# Patient Record
Sex: Male | Born: 1975 | Race: White | Hispanic: No | Marital: Married | Smoking: Current every day smoker
Health system: Southern US, Community
[De-identification: ages and names within clinical notes are randomized; demographics above are authoritative.]

## PROBLEM LIST (undated history)

## (undated) DIAGNOSIS — H8109 Meniere's disease, unspecified ear: Secondary | ICD-10-CM

## (undated) HISTORY — DX: Meniere's disease, unspecified ear: H81.09

## (undated) HISTORY — PX: OTHER SURGICAL HISTORY: SHX169

---

## 2011-03-22 ENCOUNTER — Emergency Department (HOSPITAL_COMMUNITY): Payer: BC Managed Care – PPO

## 2011-03-22 ENCOUNTER — Observation Stay (HOSPITAL_COMMUNITY)
Admission: EM | Admit: 2011-03-22 | Discharge: 2011-03-23 | Disposition: A | Discharge status: Home / Self Care | Payer: BC Managed Care – PPO | Attending: General Surgery | Admitting: General Surgery

## 2011-03-22 DIAGNOSIS — H8109 Meniere's disease, unspecified ear: Secondary | ICD-10-CM | POA: Insufficient documentation

## 2011-03-22 DIAGNOSIS — F17200 Nicotine dependence, unspecified, uncomplicated: Secondary | ICD-10-CM | POA: Insufficient documentation

## 2011-03-22 DIAGNOSIS — Y92410 Unspecified street and highway as the place of occurrence of the external cause: Secondary | ICD-10-CM | POA: Insufficient documentation

## 2011-03-22 DIAGNOSIS — S060X9A Concussion with loss of consciousness of unspecified duration, initial encounter: Principal | ICD-10-CM | POA: Insufficient documentation

## 2011-03-22 LAB — COMPREHENSIVE METABOLIC PANEL
ALT: 34 U/L (ref 0–53)
AST: 27 U/L (ref 0–37)
Albumin: 4.4 g/dL (ref 3.5–5.2)
Alkaline Phosphatase: 61 U/L (ref 39–117)
BUN: 13 mg/dL (ref 6–23)
CO2: 25 mEq/L (ref 19–32)
Calcium: 9.4 mg/dL (ref 8.4–10.5)
Chloride: 104 mEq/L (ref 96–112)
Creatinine, Ser: 1.11 mg/dL (ref 0.4–1.5)
GFR calc Af Amer: >60 mL/min (ref >60)
GFR calc non Af Amer: >60 mL/min (ref >60)
Glucose, Bld: 127 mg/dL — ABNORMAL HIGH (ref 70–99)
Potassium: 3.1 mEq/L — ABNORMAL LOW (ref 3.5–5.1)
Sodium: 140 meq/L (ref 135–145)
Total Bilirubin: 0.8 mg/dL (ref 0.3–1.2)
Total Protein: 7.2 g/dL (ref 6.0–8.3)

## 2011-03-22 LAB — PROTIME-INR
INR: 0.97 (ref 0.00–1.49)
Prothrombin Time: 13.1 s (ref 11.6–15.2)

## 2011-03-22 LAB — POCT I-STAT, CHEM 8
BUN: 19 mg/dL (ref 6–23)
Calcium, Ion: 0.99 mmol/L — ABNORMAL LOW (ref 1.12–1.32)
Chloride: 107 mEq/L (ref 96–112)
Creatinine, Ser: 1.1 mg/dL (ref 0.4–1.5)
Glucose, Bld: 126 mg/dL — ABNORMAL HIGH (ref 70–99)
HCT: 50 % (ref 39.0–52.0)
Hemoglobin: 17 g/dL (ref 13.0–17.0)
Potassium: 3.7 meq/L (ref 3.5–5.1)
Sodium: 139 meq/L (ref 135–145)
TCO2: 22 mmol/L (ref 0–100)

## 2011-03-22 LAB — LACTIC ACID, PLASMA: Lactic Acid, Venous: 2.8 mmol/L — ABNORMAL HIGH (ref 0.5–2.2)

## 2011-03-22 LAB — CBC
HCT: 46.3 % (ref 39.0–52.0)
Hemoglobin: 15.9 g/dL (ref 13.0–17.0)
MCH: 30.2 pg (ref 26.0–34.0)
MCHC: 34.3 g/dL (ref 30.0–36.0)
MCV: 88 fL (ref 78.0–100.0)
Platelets: 212 10*3/uL (ref 150–400)
RBC: 5.26 MIL/uL (ref 4.22–5.81)
RDW: 13.2 % (ref 11.5–15.5)
WBC: 10.9 10*3/uL — ABNORMAL HIGH (ref 4.0–10.5)

## 2011-03-22 LAB — ETHANOL: Alcohol, Ethyl (B): <5 mg/dL (ref 0–10)
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
## 2011-05-13 DIAGNOSIS — S060X9A Concussion with loss of consciousness of unspecified duration, initial encounter: Secondary | ICD-10-CM

## 2011-05-13 DIAGNOSIS — F0781 Postconcussional syndrome: Secondary | ICD-10-CM

## 2012-03-12 DIAGNOSIS — F0781 Postconcussional syndrome: Secondary | ICD-10-CM

## 2012-03-12 DIAGNOSIS — X58XXXA Exposure to other specified factors, initial encounter: Secondary | ICD-10-CM

## 2012-03-12 DIAGNOSIS — S060X9A Concussion with loss of consciousness of unspecified duration, initial encounter: Secondary | ICD-10-CM

## 2012-03-13 DIAGNOSIS — F0781 Postconcussional syndrome: Secondary | ICD-10-CM

## 2012-03-13 DIAGNOSIS — S060X9A Concussion with loss of consciousness of unspecified duration, initial encounter: Secondary | ICD-10-CM

## 2012-03-13 DIAGNOSIS — X58XXXA Exposure to other specified factors, initial encounter: Secondary | ICD-10-CM
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
## 2012-11-01 ENCOUNTER — Encounter (HOSPITAL_COMMUNITY): Payer: Self-pay | Admitting: Psychology

## 2012-11-01 ENCOUNTER — Ambulatory Visit (INDEPENDENT_AMBULATORY_CARE_PROVIDER_SITE_OTHER): Payer: 59 | Admitting: Psychology

## 2012-11-01 DIAGNOSIS — F0781 Postconcussional syndrome: Secondary | ICD-10-CM
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
## 2012-11-15 ENCOUNTER — Ambulatory Visit (INDEPENDENT_AMBULATORY_CARE_PROVIDER_SITE_OTHER): Payer: 59 | Admitting: Psychology

## 2012-11-15 DIAGNOSIS — F0781 Postconcussional syndrome: Secondary | ICD-10-CM

## 2012-11-20 ENCOUNTER — Encounter (HOSPITAL_COMMUNITY): Payer: Self-pay | Admitting: Psychology
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
## 2013-01-14 ENCOUNTER — Ambulatory Visit (INDEPENDENT_AMBULATORY_CARE_PROVIDER_SITE_OTHER): Payer: 59 | Admitting: Psychology

## 2013-01-14 DIAGNOSIS — F0781 Postconcussional syndrome: Secondary | ICD-10-CM

## 2013-01-17 ENCOUNTER — Encounter (HOSPITAL_COMMUNITY): Payer: Self-pay | Admitting: Psychology
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
## 2013-01-29 ENCOUNTER — Encounter (HOSPITAL_COMMUNITY): Payer: Self-pay | Admitting: Psychology

## 2013-01-29 ENCOUNTER — Ambulatory Visit (INDEPENDENT_AMBULATORY_CARE_PROVIDER_SITE_OTHER): Payer: Self-pay | Admitting: Psychology

## 2013-01-29 DIAGNOSIS — F0781 Postconcussional syndrome: Secondary | ICD-10-CM
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
## 2013-02-19 ENCOUNTER — Ambulatory Visit (INDEPENDENT_AMBULATORY_CARE_PROVIDER_SITE_OTHER): Payer: 59 | Admitting: Psychology

## 2013-02-19 ENCOUNTER — Encounter (HOSPITAL_COMMUNITY): Payer: Self-pay | Admitting: Psychology

## 2013-02-19 DIAGNOSIS — F0781 Postconcussional syndrome: Secondary | ICD-10-CM
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
## 2013-03-19 ENCOUNTER — Ambulatory Visit (INDEPENDENT_AMBULATORY_CARE_PROVIDER_SITE_OTHER): Payer: 59 | Admitting: Psychology

## 2013-03-19 ENCOUNTER — Encounter (HOSPITAL_COMMUNITY): Payer: Self-pay | Admitting: Psychology

## 2013-03-19 DIAGNOSIS — F0781 Postconcussional syndrome: Secondary | ICD-10-CM
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
## 2015-03-26 ENCOUNTER — Other Ambulatory Visit: Payer: Self-pay | Admitting: Otolaryngology

## 2015-03-26 DIAGNOSIS — H81311 Aural vertigo, right ear: Secondary | ICD-10-CM

## 2015-03-26 DIAGNOSIS — H9190 Unspecified hearing loss, unspecified ear: Secondary | ICD-10-CM

## 2015-03-26 DIAGNOSIS — H8101 Meniere's disease, right ear: Secondary | ICD-10-CM

## 2015-04-07 ENCOUNTER — Inpatient Hospital Stay: Admission: RE | Admit: 2015-04-07 | Payer: Self-pay | Source: Ambulatory Visit

## 2015-04-24 ENCOUNTER — Ambulatory Visit
Admission: RE | Admit: 2015-04-24 | Discharge: 2015-04-24 | Disposition: A | Discharge status: Home / Self Care | Payer: BLUE CROSS/BLUE SHIELD | Source: Ambulatory Visit | Attending: Otolaryngology | Admitting: Otolaryngology

## 2015-04-24 DIAGNOSIS — H9190 Unspecified hearing loss, unspecified ear: Secondary | ICD-10-CM

## 2015-04-24 DIAGNOSIS — H81311 Aural vertigo, right ear: Secondary | ICD-10-CM

## 2015-04-24 DIAGNOSIS — H8101 Meniere's disease, right ear: Secondary | ICD-10-CM

## 2015-04-24 MED ORDER — GADOBENATE DIMEGLUMINE 529 MG/ML IV SOLN
20.0000 mL | Freq: Once | INTRAVENOUS | Status: AC | PRN
  Administered 2015-04-24: 20 mL via INTRAVENOUS

## 2015-06-22 DIAGNOSIS — H81311 Aural vertigo, right ear: Secondary | ICD-10-CM | POA: Insufficient documentation

## 2016-05-31 IMAGING — MR MR HEAD WO/W CM
10 of 11 series · 33 of 48 positions shown · IV contrast (20ml multihance)
Comparison: Head CT 03/22/2011

CLINICAL DATA: Right-sided hearing loss with vertigo and loss of
balance. History of Meniere's disease. History of shunt placed in
the right ear in 6116. History of mastoidectomy.

EXAM:
MRI HEAD WITHOUT AND WITH CONTRAST
TECHNIQUE: Multiplanar, multiecho pulse sequences of the brain and surrounding
structures were obtained without and with intravenous contrast.
CONTRAST:  20mL MULTIHANCE GADOBENATE DIMEGLUMINE 529 MG/ML IV SOLN

[Series 2: T1 · sagittal · 5.0mm · 0.47mm/px · 2 of 21 slices shown (1 of 3)]
[im 1/21]
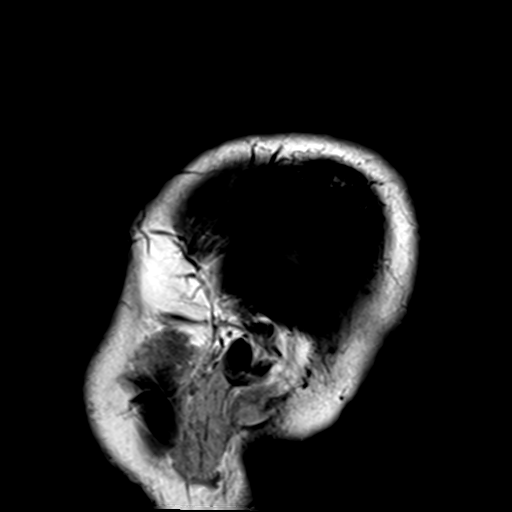
[im 21/21]
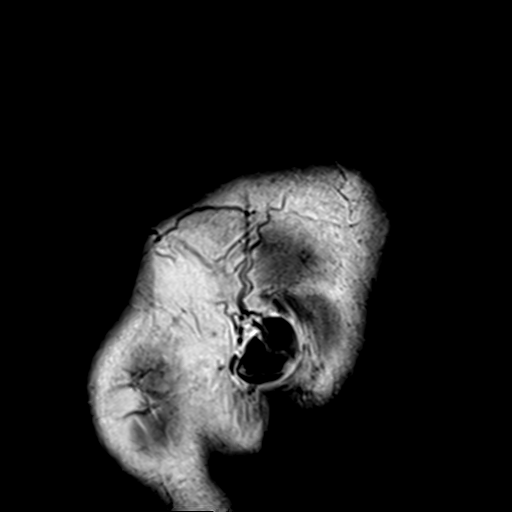

[Series 3: DWI · axial · 3.0mm · 1.80mm/px · z∈[+27,+180]mm · 9 of 108 slices shown (1 of 2)]
[im 1/108]
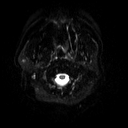
[im 18/108]
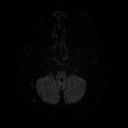
[im 36/108]
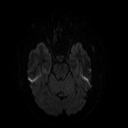
[im 45/108]
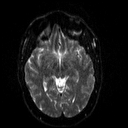
[im 54/108]
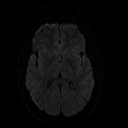
[im 63/108]
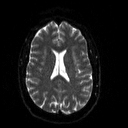
[im 72/108]
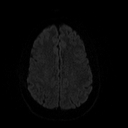
[im 90/108]
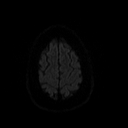
[im 108/108]
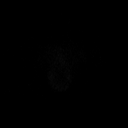

[Series 4: DWI · axial · 3.0mm · 1.80mm/px · z∈[+27,+180]mm · 6 of 53 slices shown (2 of 2)]
[im 1/53]
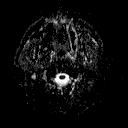
[im 11/53]
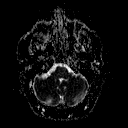
[im 21/53]
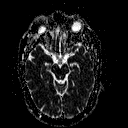
[im 32/53]
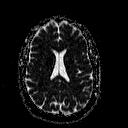
[im 42/53]
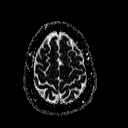
[im 53/53]
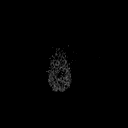

[Series 5: T2 · axial · 5.0mm · 0.45mm/px · z∈[+36,+184]mm · 3 of 25 slices shown]
[im 1/25]
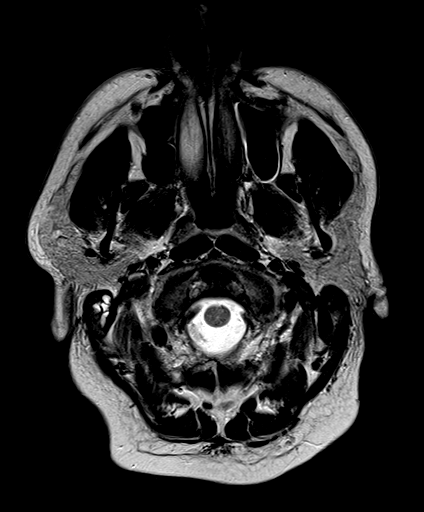
[im 13/25]
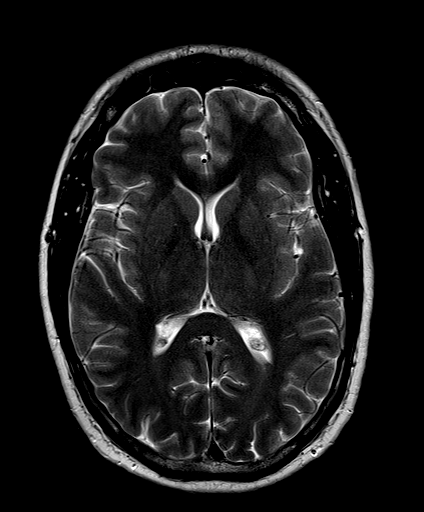
[im 25/25]
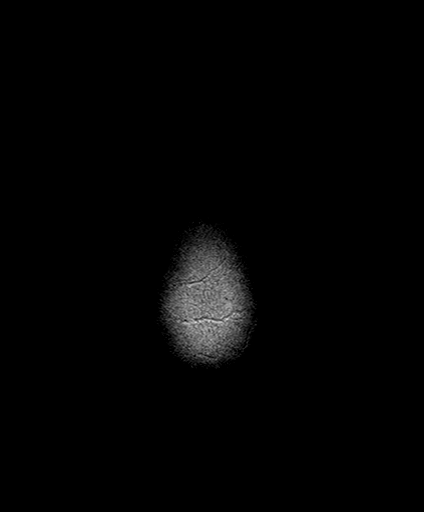

[Series 6: FLAIR · axial · 5.0mm · 0.45mm/px · z∈[+36,+183]mm · 3 of 25 slices shown]
[im 1/25]
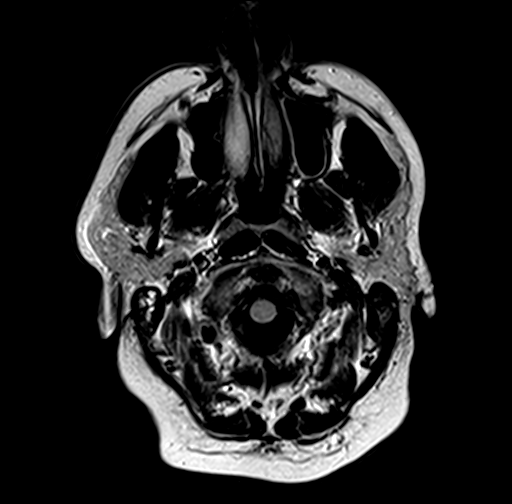
[im 13/25]
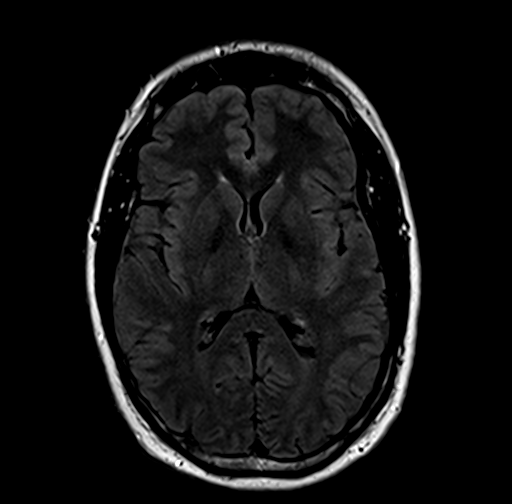
[im 25/25]
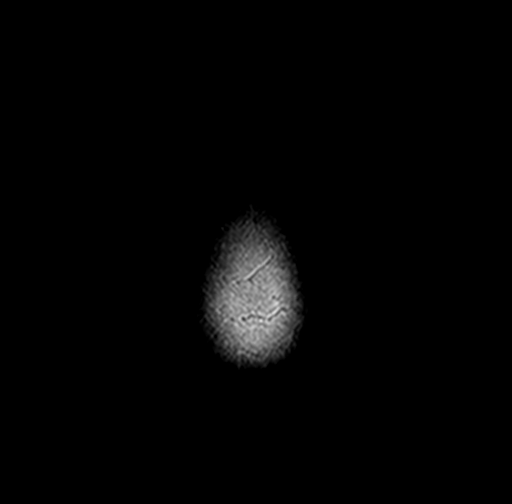

[Series 7: T1 · coronal · 3.0mm · 0.35mm/px · 2 of 14 slices shown (2 of 3)]
[im 1/14]
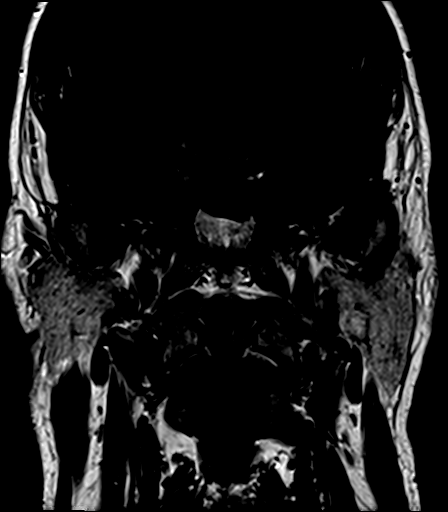
[im 14/14]
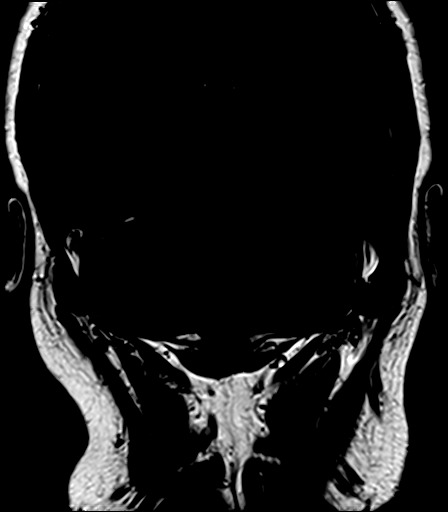

[Series 8: T1 · axial · 3.0mm · 0.35mm/px · z∈[-1,+42]mm · 2 of 14 slices shown (3 of 3)]
[im 1/14]
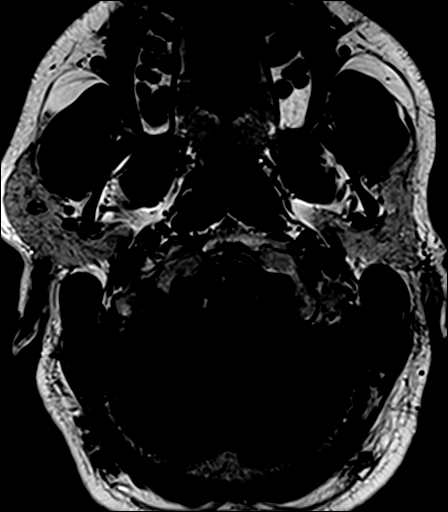
[im 14/14]
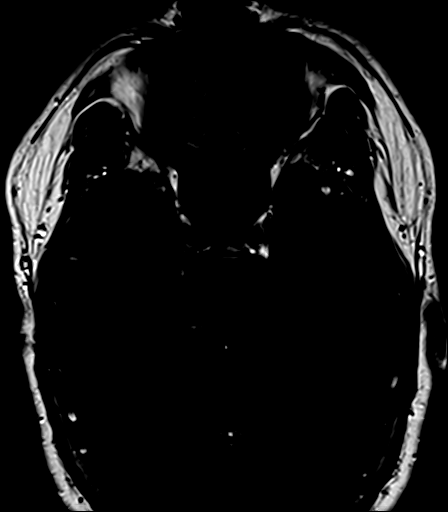

[Series 9: bSSFP · axial · 1.0mm · 0.28mm/px · z∈[+3,+14]mm · 2 of 36 slices shown]
[im 1/36]
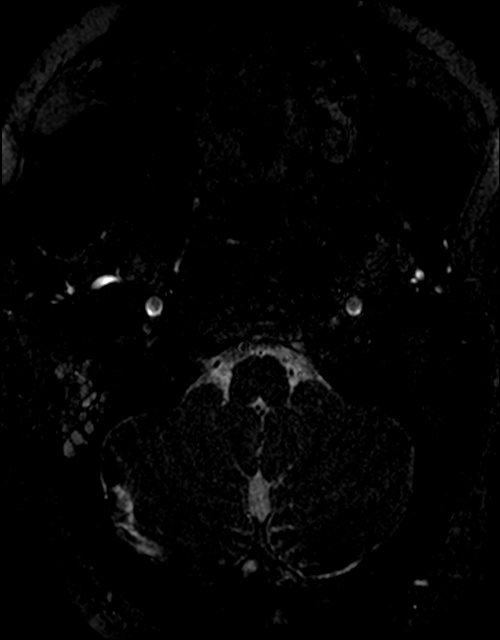
[im 12/36]
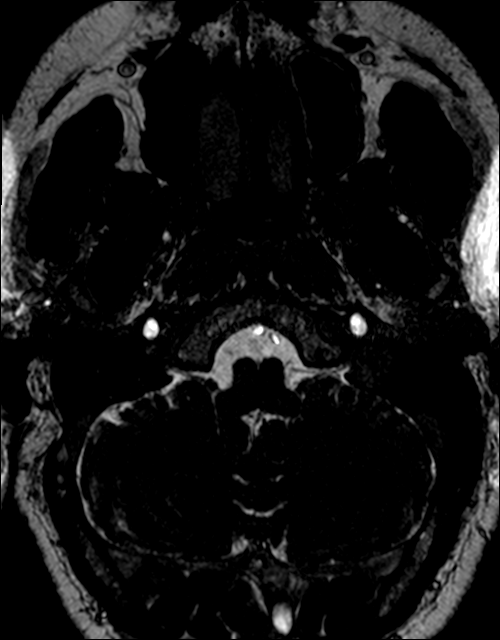

[Series 10: T1 post-contrast · coronal · 3.0mm · 0.35mm/px · 2 of 14 slices shown (1 of 2)]
[im 1/14]
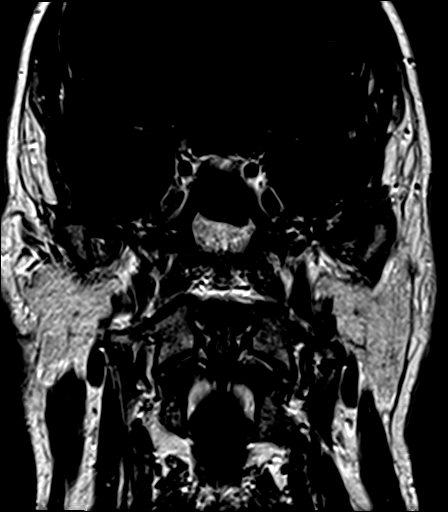
[im 14/14]
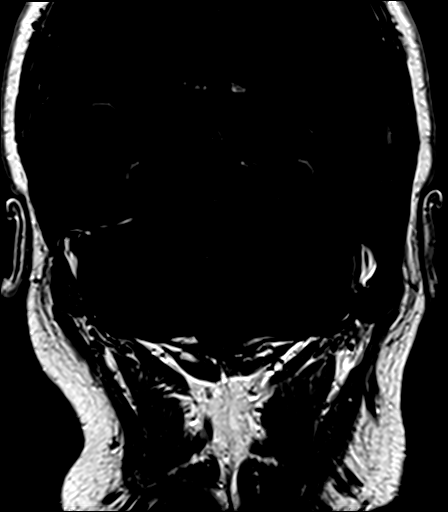

[Series 11: T1 post-contrast · axial · 3.0mm · 0.35mm/px · z∈[-1,+42]mm · 2 of 14 slices shown (2 of 2)]
[im 1/14]
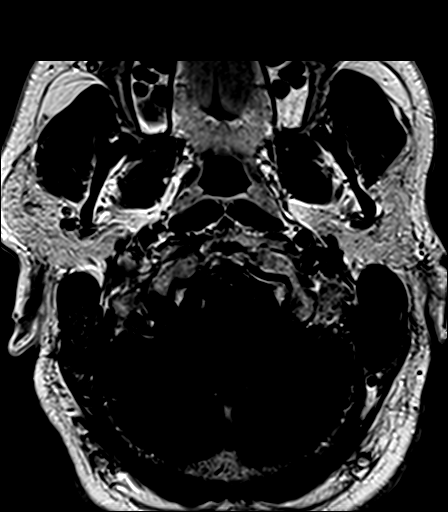
[im 14/14]
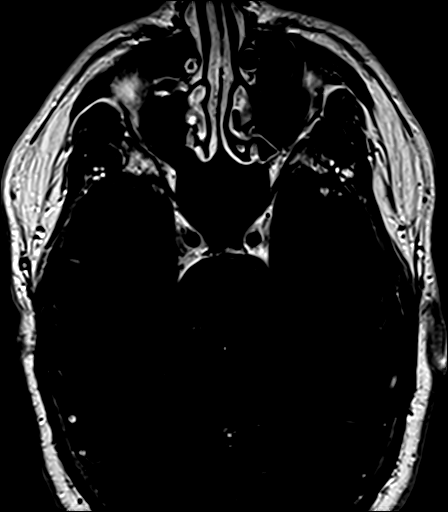

[33 of 48 positions shown; findings below may reference images not displayed]

FINDINGS: Calvarium and upper cervical spine: No marrow signal abnormality.

Orbits: No significant findings.

Sinuses and mastoid: There is chronic complete opacification of a
posterior right ethmoid air cell with central T2 hyperintense
material, likely inspissated secretion. The sinus is mildly
expansile, but stable from 5605. Over this long interval, would have
expected a mucocele to enlarge. There is also a chronically
opacified air cell at the roof of the right orbit, without expansion
on sagittal T1 weighted imaging.

Right wall up mastoidectomy, better depicted on previous CT. Chronic
fluid or mucosal thickening in the right mastoid tip with sclerosis
seen by previous CT. There is no diffusion signal abnormality in the
temporal bones to suggest cholesteatoma. Symmetric and normal
labyrinthine signal and absent enhancement. No vascular loop.

Brain: No acute or remote infarct, hemorrhage, hydrocephalus, or
mass lesion. No evidence of large vessel occlusion. No abnormal
intracranial enhancement.
IMPRESSION: 1. No vestibular schwannoma or other explanation for right hearing
loss.
2. Chronic mastoiditis in the right mastoid tip. A wall up right
mastoidectomy defect is clear. No temporal bone diffusion signal to
suggest cholesteatoma.
3. Chronic sinusitis, stable from 5605.

## 2016-10-13 DIAGNOSIS — Z23 Encounter for immunization: Secondary | ICD-10-CM | POA: Diagnosis not present

## 2017-02-06 DIAGNOSIS — J0140 Acute pansinusitis, unspecified: Secondary | ICD-10-CM | POA: Diagnosis not present

## 2017-05-20 DIAGNOSIS — S42032A Displaced fracture of lateral end of left clavicle, initial encounter for closed fracture: Secondary | ICD-10-CM | POA: Diagnosis not present

## 2017-05-20 DIAGNOSIS — S42002A Fracture of unspecified part of left clavicle, initial encounter for closed fracture: Secondary | ICD-10-CM | POA: Diagnosis not present

## 2017-05-20 DIAGNOSIS — G8911 Acute pain due to trauma: Secondary | ICD-10-CM | POA: Diagnosis not present

## 2017-05-20 DIAGNOSIS — S20412A Abrasion of left back wall of thorax, initial encounter: Secondary | ICD-10-CM | POA: Diagnosis not present

## 2017-05-20 DIAGNOSIS — M25512 Pain in left shoulder: Secondary | ICD-10-CM | POA: Diagnosis not present

## 2017-05-22 DIAGNOSIS — S42025A Nondisplaced fracture of shaft of left clavicle, initial encounter for closed fracture: Secondary | ICD-10-CM | POA: Diagnosis not present

## 2017-05-24 DIAGNOSIS — S42032A Displaced fracture of lateral end of left clavicle, initial encounter for closed fracture: Secondary | ICD-10-CM | POA: Diagnosis not present

## 2017-05-24 DIAGNOSIS — S42022A Displaced fracture of shaft of left clavicle, initial encounter for closed fracture: Secondary | ICD-10-CM | POA: Diagnosis not present

## 2017-05-24 DIAGNOSIS — Y999 Unspecified external cause status: Secondary | ICD-10-CM | POA: Diagnosis not present

## 2017-06-05 DIAGNOSIS — S42032D Displaced fracture of lateral end of left clavicle, subsequent encounter for fracture with routine healing: Secondary | ICD-10-CM | POA: Diagnosis not present

## 2017-06-13 DIAGNOSIS — D485 Neoplasm of uncertain behavior of skin: Secondary | ICD-10-CM | POA: Diagnosis not present

## 2017-06-13 DIAGNOSIS — D225 Melanocytic nevi of trunk: Secondary | ICD-10-CM | POA: Diagnosis not present

## 2017-06-13 DIAGNOSIS — D2262 Melanocytic nevi of left upper limb, including shoulder: Secondary | ICD-10-CM | POA: Diagnosis not present

## 2017-06-26 DIAGNOSIS — S42032D Displaced fracture of lateral end of left clavicle, subsequent encounter for fracture with routine healing: Secondary | ICD-10-CM | POA: Diagnosis not present

## 2017-08-01 DIAGNOSIS — S42032D Displaced fracture of lateral end of left clavicle, subsequent encounter for fracture with routine healing: Secondary | ICD-10-CM | POA: Diagnosis not present

## 2017-09-19 DIAGNOSIS — S42032D Displaced fracture of lateral end of left clavicle, subsequent encounter for fracture with routine healing: Secondary | ICD-10-CM | POA: Diagnosis not present

## 2017-10-02 DIAGNOSIS — H6980 Other specified disorders of Eustachian tube, unspecified ear: Secondary | ICD-10-CM | POA: Diagnosis not present

## 2017-10-02 DIAGNOSIS — H9313 Tinnitus, bilateral: Secondary | ICD-10-CM | POA: Insufficient documentation

## 2017-10-02 DIAGNOSIS — J322 Chronic ethmoidal sinusitis: Secondary | ICD-10-CM | POA: Insufficient documentation

## 2017-10-02 DIAGNOSIS — H6990 Unspecified Eustachian tube disorder, unspecified ear: Secondary | ICD-10-CM | POA: Insufficient documentation

## 2017-10-02 DIAGNOSIS — J310 Chronic rhinitis: Secondary | ICD-10-CM | POA: Insufficient documentation

## 2017-10-02 DIAGNOSIS — H8101 Meniere's disease, right ear: Secondary | ICD-10-CM | POA: Insufficient documentation

## 2017-12-27 DIAGNOSIS — D1801 Hemangioma of skin and subcutaneous tissue: Secondary | ICD-10-CM | POA: Diagnosis not present

## 2017-12-27 DIAGNOSIS — D225 Melanocytic nevi of trunk: Secondary | ICD-10-CM | POA: Diagnosis not present

## 2017-12-27 DIAGNOSIS — L814 Other melanin hyperpigmentation: Secondary | ICD-10-CM | POA: Diagnosis not present

## 2017-12-27 DIAGNOSIS — D485 Neoplasm of uncertain behavior of skin: Secondary | ICD-10-CM | POA: Diagnosis not present

## 2017-12-27 DIAGNOSIS — Z86018 Personal history of other benign neoplasm: Secondary | ICD-10-CM | POA: Diagnosis not present

## 2018-03-21 DIAGNOSIS — J0100 Acute maxillary sinusitis, unspecified: Secondary | ICD-10-CM | POA: Diagnosis not present

## 2018-07-16 DIAGNOSIS — H9041 Sensorineural hearing loss, unilateral, right ear, with unrestricted hearing on the contralateral side: Secondary | ICD-10-CM | POA: Diagnosis not present

## 2018-07-16 DIAGNOSIS — H8101 Meniere's disease, right ear: Secondary | ICD-10-CM | POA: Diagnosis not present

## 2018-07-16 DIAGNOSIS — G43809 Other migraine, not intractable, without status migrainosus: Secondary | ICD-10-CM | POA: Diagnosis not present

## 2018-07-16 DIAGNOSIS — H9311 Tinnitus, right ear: Secondary | ICD-10-CM | POA: Diagnosis not present

## 2018-07-27 DIAGNOSIS — R42 Dizziness and giddiness: Secondary | ICD-10-CM | POA: Diagnosis not present

## 2018-10-09 DIAGNOSIS — J329 Chronic sinusitis, unspecified: Secondary | ICD-10-CM | POA: Diagnosis not present

## 2018-10-09 DIAGNOSIS — H8101 Meniere's disease, right ear: Secondary | ICD-10-CM | POA: Diagnosis not present

## 2018-10-09 DIAGNOSIS — J3489 Other specified disorders of nose and nasal sinuses: Secondary | ICD-10-CM | POA: Diagnosis not present

## 2018-10-09 DIAGNOSIS — J309 Allergic rhinitis, unspecified: Secondary | ICD-10-CM | POA: Diagnosis not present

## 2018-10-11 DIAGNOSIS — J328 Other chronic sinusitis: Secondary | ICD-10-CM | POA: Diagnosis not present

## 2018-10-17 DIAGNOSIS — J301 Allergic rhinitis due to pollen: Secondary | ICD-10-CM | POA: Diagnosis not present

## 2018-10-23 DIAGNOSIS — J329 Chronic sinusitis, unspecified: Secondary | ICD-10-CM | POA: Diagnosis not present

## 2018-10-23 DIAGNOSIS — H8101 Meniere's disease, right ear: Secondary | ICD-10-CM | POA: Diagnosis not present

## 2018-11-19 DIAGNOSIS — G43809 Other migraine, not intractable, without status migrainosus: Secondary | ICD-10-CM | POA: Diagnosis not present

## 2018-11-19 DIAGNOSIS — H8101 Meniere's disease, right ear: Secondary | ICD-10-CM | POA: Diagnosis not present

## 2018-11-19 DIAGNOSIS — H9041 Sensorineural hearing loss, unilateral, right ear, with unrestricted hearing on the contralateral side: Secondary | ICD-10-CM | POA: Diagnosis not present

## 2018-11-19 DIAGNOSIS — H9313 Tinnitus, bilateral: Secondary | ICD-10-CM | POA: Diagnosis not present

## 2018-11-29 DIAGNOSIS — G43809 Other migraine, not intractable, without status migrainosus: Secondary | ICD-10-CM | POA: Diagnosis not present

## 2018-11-29 DIAGNOSIS — J3489 Other specified disorders of nose and nasal sinuses: Secondary | ICD-10-CM | POA: Diagnosis not present

## 2018-11-29 DIAGNOSIS — H8101 Meniere's disease, right ear: Secondary | ICD-10-CM | POA: Diagnosis not present

## 2018-12-23 DIAGNOSIS — J019 Acute sinusitis, unspecified: Secondary | ICD-10-CM | POA: Diagnosis not present

## 2018-12-23 DIAGNOSIS — J069 Acute upper respiratory infection, unspecified: Secondary | ICD-10-CM | POA: Diagnosis not present

## 2019-02-19 DIAGNOSIS — J329 Chronic sinusitis, unspecified: Secondary | ICD-10-CM | POA: Diagnosis not present

## 2019-02-19 DIAGNOSIS — J342 Deviated nasal septum: Secondary | ICD-10-CM | POA: Diagnosis not present

## 2019-02-19 DIAGNOSIS — H8101 Meniere's disease, right ear: Secondary | ICD-10-CM | POA: Diagnosis not present

## 2019-02-19 DIAGNOSIS — J3489 Other specified disorders of nose and nasal sinuses: Secondary | ICD-10-CM | POA: Diagnosis not present

## 2019-02-20 DIAGNOSIS — R42 Dizziness and giddiness: Secondary | ICD-10-CM | POA: Diagnosis not present

## 2019-02-20 DIAGNOSIS — H8101 Meniere's disease, right ear: Secondary | ICD-10-CM | POA: Diagnosis not present

## 2019-02-20 DIAGNOSIS — H903 Sensorineural hearing loss, bilateral: Secondary | ICD-10-CM | POA: Diagnosis not present

## 2019-02-20 DIAGNOSIS — G43809 Other migraine, not intractable, without status migrainosus: Secondary | ICD-10-CM | POA: Diagnosis not present

## 2019-02-25 DIAGNOSIS — D225 Melanocytic nevi of trunk: Secondary | ICD-10-CM | POA: Diagnosis not present

## 2019-02-25 DIAGNOSIS — D485 Neoplasm of uncertain behavior of skin: Secondary | ICD-10-CM | POA: Diagnosis not present

## 2019-02-25 DIAGNOSIS — D235 Other benign neoplasm of skin of trunk: Secondary | ICD-10-CM | POA: Diagnosis not present

## 2019-02-25 DIAGNOSIS — Z86018 Personal history of other benign neoplasm: Secondary | ICD-10-CM | POA: Diagnosis not present

## 2019-02-25 DIAGNOSIS — D2261 Melanocytic nevi of right upper limb, including shoulder: Secondary | ICD-10-CM | POA: Diagnosis not present

## 2019-03-25 DIAGNOSIS — J329 Chronic sinusitis, unspecified: Secondary | ICD-10-CM | POA: Diagnosis not present

## 2019-03-25 DIAGNOSIS — H9041 Sensorineural hearing loss, unilateral, right ear, with unrestricted hearing on the contralateral side: Secondary | ICD-10-CM | POA: Diagnosis not present

## 2019-06-07 DIAGNOSIS — H8101 Meniere's disease, right ear: Secondary | ICD-10-CM | POA: Diagnosis not present

## 2019-06-14 DIAGNOSIS — H6691 Otitis media, unspecified, right ear: Secondary | ICD-10-CM | POA: Diagnosis not present

## 2019-06-14 DIAGNOSIS — H7291 Unspecified perforation of tympanic membrane, right ear: Secondary | ICD-10-CM | POA: Diagnosis not present

## 2019-06-25 DIAGNOSIS — H7291 Unspecified perforation of tympanic membrane, right ear: Secondary | ICD-10-CM | POA: Diagnosis not present

## 2019-06-25 DIAGNOSIS — H8101 Meniere's disease, right ear: Secondary | ICD-10-CM | POA: Diagnosis not present

## 2019-07-10 DIAGNOSIS — H7291 Unspecified perforation of tympanic membrane, right ear: Secondary | ICD-10-CM | POA: Diagnosis not present

## 2019-07-31 DIAGNOSIS — Z01812 Encounter for preprocedural laboratory examination: Secondary | ICD-10-CM | POA: Diagnosis not present

## 2019-08-06 DIAGNOSIS — H722X1 Other marginal perforations of tympanic membrane, right ear: Secondary | ICD-10-CM | POA: Diagnosis not present

## 2019-10-14 DIAGNOSIS — H9041 Sensorineural hearing loss, unilateral, right ear, with unrestricted hearing on the contralateral side: Secondary | ICD-10-CM | POA: Diagnosis not present

## 2020-03-02 DIAGNOSIS — D225 Melanocytic nevi of trunk: Secondary | ICD-10-CM | POA: Diagnosis not present

## 2020-03-02 DIAGNOSIS — Z86018 Personal history of other benign neoplasm: Secondary | ICD-10-CM | POA: Diagnosis not present

## 2020-03-02 DIAGNOSIS — D2271 Melanocytic nevi of right lower limb, including hip: Secondary | ICD-10-CM | POA: Diagnosis not present

## 2020-03-02 DIAGNOSIS — L910 Hypertrophic scar: Secondary | ICD-10-CM | POA: Diagnosis not present

## 2020-03-02 DIAGNOSIS — D2239 Melanocytic nevi of other parts of face: Secondary | ICD-10-CM | POA: Diagnosis not present

## 2020-03-06 ENCOUNTER — Ambulatory Visit: Payer: BC Managed Care – PPO | Attending: Internal Medicine

## 2020-03-06 DIAGNOSIS — Z23 Encounter for immunization: Secondary | ICD-10-CM

## 2020-03-06 NOTE — Progress Notes (Signed)
   Covid-19 Vaccination Clinic  Name:  [name]    MRN: [id number] DOB: 1976/10/12  03/06/2020  [name] was observed post Covid-19 immunization for 15 minutes without incident. He was provided with Vaccine Information Sheet and instruction to access the V-Safe system.   [name] was instructed to call 911 with any severe reactions post vaccine: Marland Kitchen Difficulty breathing  . Swelling of face and throat  . A fast heartbeat  . A bad rash all over body  . Dizziness and weakness   Immunizations Administered    Name Date Dose VIS Date Route   Pfizer COVID-19 Vaccine 03/06/2020 10:08 AM 0.3 mL 11/22/2019 Intramuscular   Manufacturer: [employer], [employer]   Lot: [id number]   NDC: [address]-1

## 2020-03-31 ENCOUNTER — Ambulatory Visit: Payer: Self-pay

## 2020-04-07 ENCOUNTER — Ambulatory Visit: Payer: BC Managed Care – PPO | Attending: Internal Medicine

## 2020-04-07 DIAGNOSIS — Z23 Encounter for immunization: Secondary | ICD-10-CM

## 2020-04-07 NOTE — Progress Notes (Signed)
   Covid-19 Vaccination Clinic  Name:  [name]    MRN: [id number] DOB: 06/28/1976  04/07/2020  [name] was observed post Covid-19 immunization for 15 minutes without incident. He was provided with Vaccine Information Sheet and instruction to access the V-Safe system.   [name] was instructed to call 911 with any severe reactions post vaccine: Marland Kitchen Difficulty breathing  . Swelling of face and throat  . A fast heartbeat  . A bad rash all over body  . Dizziness and weakness   Immunizations Administered    Name Date Dose VIS Date Route   Pfizer COVID-19 Vaccine 04/07/2020 10:57 AM 0.3 mL 02/05/2019 Intramuscular   Manufacturer: [employer], [employer]   Lot: [id number]   NDC: [address]-6

## 2020-04-09 DIAGNOSIS — L910 Hypertrophic scar: Secondary | ICD-10-CM | POA: Diagnosis not present

## 2020-04-13 DIAGNOSIS — H8101 Meniere's disease, right ear: Secondary | ICD-10-CM | POA: Diagnosis not present

## 2020-04-13 DIAGNOSIS — Z9889 Other specified postprocedural states: Secondary | ICD-10-CM | POA: Diagnosis not present

## 2020-04-13 DIAGNOSIS — H7291 Unspecified perforation of tympanic membrane, right ear: Secondary | ICD-10-CM | POA: Diagnosis not present

## 2020-08-26 ENCOUNTER — Other Ambulatory Visit: Payer: BC Managed Care – PPO

## 2020-08-26 ENCOUNTER — Other Ambulatory Visit: Payer: Self-pay

## 2020-08-26 DIAGNOSIS — Z20822 Contact with and (suspected) exposure to covid-19: Secondary | ICD-10-CM

## 2020-08-29 LAB — NOVEL CORONAVIRUS, NAA: SARS-CoV-2, NAA: NOT DETECTED

## 2020-10-12 DIAGNOSIS — G43809 Other migraine, not intractable, without status migrainosus: Secondary | ICD-10-CM | POA: Diagnosis not present

## 2020-10-12 DIAGNOSIS — H8101 Meniere's disease, right ear: Secondary | ICD-10-CM | POA: Diagnosis not present

## 2020-12-23 DIAGNOSIS — Z1152 Encounter for screening for COVID-19: Secondary | ICD-10-CM | POA: Diagnosis not present

## 2021-01-12 DIAGNOSIS — N410 Acute prostatitis: Secondary | ICD-10-CM | POA: Diagnosis not present

## 2021-01-12 DIAGNOSIS — R300 Dysuria: Secondary | ICD-10-CM | POA: Diagnosis not present

## 2021-02-21 DIAGNOSIS — H8103 Meniere's disease, bilateral: Secondary | ICD-10-CM | POA: Diagnosis not present

## 2021-03-09 DIAGNOSIS — D485 Neoplasm of uncertain behavior of skin: Secondary | ICD-10-CM | POA: Diagnosis not present

## 2021-03-09 DIAGNOSIS — L578 Other skin changes due to chronic exposure to nonionizing radiation: Secondary | ICD-10-CM | POA: Diagnosis not present

## 2021-03-09 DIAGNOSIS — Z86018 Personal history of other benign neoplasm: Secondary | ICD-10-CM | POA: Diagnosis not present

## 2021-03-09 DIAGNOSIS — D225 Melanocytic nevi of trunk: Secondary | ICD-10-CM | POA: Diagnosis not present

## 2021-03-09 DIAGNOSIS — L570 Actinic keratosis: Secondary | ICD-10-CM | POA: Diagnosis not present

## 2021-03-09 DIAGNOSIS — D2271 Melanocytic nevi of right lower limb, including hip: Secondary | ICD-10-CM | POA: Diagnosis not present

## 2021-03-09 DIAGNOSIS — L918 Other hypertrophic disorders of the skin: Secondary | ICD-10-CM | POA: Diagnosis not present

## 2021-03-09 DIAGNOSIS — L910 Hypertrophic scar: Secondary | ICD-10-CM | POA: Diagnosis not present

## 2021-03-19 DIAGNOSIS — J019 Acute sinusitis, unspecified: Secondary | ICD-10-CM | POA: Diagnosis not present

## 2021-08-06 DIAGNOSIS — N410 Acute prostatitis: Secondary | ICD-10-CM | POA: Diagnosis not present

## 2021-11-12 DIAGNOSIS — H9041 Sensorineural hearing loss, unilateral, right ear, with unrestricted hearing on the contralateral side: Secondary | ICD-10-CM | POA: Diagnosis not present

## 2021-12-28 DIAGNOSIS — N410 Acute prostatitis: Secondary | ICD-10-CM | POA: Diagnosis not present

## 2022-05-06 DIAGNOSIS — D239 Other benign neoplasm of skin, unspecified: Secondary | ICD-10-CM | POA: Diagnosis not present

## 2022-05-06 DIAGNOSIS — L578 Other skin changes due to chronic exposure to nonionizing radiation: Secondary | ICD-10-CM | POA: Diagnosis not present

## 2022-05-06 DIAGNOSIS — L910 Hypertrophic scar: Secondary | ICD-10-CM | POA: Diagnosis not present

## 2022-05-06 DIAGNOSIS — D225 Melanocytic nevi of trunk: Secondary | ICD-10-CM | POA: Diagnosis not present

## 2022-05-06 DIAGNOSIS — L821 Other seborrheic keratosis: Secondary | ICD-10-CM | POA: Diagnosis not present

## 2022-06-16 DIAGNOSIS — L910 Hypertrophic scar: Secondary | ICD-10-CM | POA: Diagnosis not present

## 2022-07-08 DIAGNOSIS — Z3009 Encounter for other general counseling and advice on contraception: Secondary | ICD-10-CM | POA: Diagnosis not present

## 2022-07-08 DIAGNOSIS — R102 Pelvic and perineal pain: Secondary | ICD-10-CM | POA: Diagnosis not present

## 2022-08-08 DIAGNOSIS — L910 Hypertrophic scar: Secondary | ICD-10-CM | POA: Diagnosis not present

## 2022-08-09 DIAGNOSIS — Z302 Encounter for sterilization: Secondary | ICD-10-CM | POA: Diagnosis not present

## 2023-03-21 DIAGNOSIS — Z86018 Personal history of other benign neoplasm: Secondary | ICD-10-CM | POA: Diagnosis not present

## 2023-03-21 DIAGNOSIS — D225 Melanocytic nevi of trunk: Secondary | ICD-10-CM | POA: Diagnosis not present

## 2023-03-21 DIAGNOSIS — L578 Other skin changes due to chronic exposure to nonionizing radiation: Secondary | ICD-10-CM | POA: Diagnosis not present

## 2023-03-21 DIAGNOSIS — D2272 Melanocytic nevi of left lower limb, including hip: Secondary | ICD-10-CM | POA: Diagnosis not present

## 2023-05-31 DIAGNOSIS — J0141 Acute recurrent pansinusitis: Secondary | ICD-10-CM | POA: Diagnosis not present

## 2023-06-20 DIAGNOSIS — J0140 Acute pansinusitis, unspecified: Secondary | ICD-10-CM | POA: Diagnosis not present

## 2023-06-20 DIAGNOSIS — J3489 Other specified disorders of nose and nasal sinuses: Secondary | ICD-10-CM | POA: Diagnosis not present

## 2023-06-21 DIAGNOSIS — H40013 Open angle with borderline findings, low risk, bilateral: Secondary | ICD-10-CM | POA: Diagnosis not present

## 2023-07-13 DIAGNOSIS — J324 Chronic pansinusitis: Secondary | ICD-10-CM | POA: Diagnosis not present

## 2023-08-04 DIAGNOSIS — J329 Chronic sinusitis, unspecified: Secondary | ICD-10-CM | POA: Diagnosis not present

## 2023-08-04 DIAGNOSIS — J324 Chronic pansinusitis: Secondary | ICD-10-CM | POA: Diagnosis not present

## 2023-08-09 DIAGNOSIS — J329 Chronic sinusitis, unspecified: Secondary | ICD-10-CM | POA: Diagnosis not present

## 2023-08-09 DIAGNOSIS — H8101 Meniere's disease, right ear: Secondary | ICD-10-CM | POA: Diagnosis not present

## 2023-08-31 DIAGNOSIS — Z13220 Encounter for screening for lipoid disorders: Secondary | ICD-10-CM | POA: Diagnosis not present

## 2023-08-31 DIAGNOSIS — Z125 Encounter for screening for malignant neoplasm of prostate: Secondary | ICD-10-CM | POA: Diagnosis not present

## 2023-08-31 DIAGNOSIS — Z0000 Encounter for general adult medical examination without abnormal findings: Secondary | ICD-10-CM | POA: Diagnosis not present

## 2023-11-06 DIAGNOSIS — M5412 Radiculopathy, cervical region: Secondary | ICD-10-CM | POA: Diagnosis not present

## 2023-11-06 DIAGNOSIS — M542 Cervicalgia: Secondary | ICD-10-CM | POA: Diagnosis not present

## 2023-11-23 DIAGNOSIS — M542 Cervicalgia: Secondary | ICD-10-CM | POA: Diagnosis not present

## 2023-12-01 DIAGNOSIS — R519 Headache, unspecified: Secondary | ICD-10-CM | POA: Diagnosis not present

## 2023-12-04 DIAGNOSIS — Z1389 Encounter for screening for other disorder: Secondary | ICD-10-CM | POA: Diagnosis not present

## 2023-12-08 DIAGNOSIS — M542 Cervicalgia: Secondary | ICD-10-CM | POA: Diagnosis not present

## 2023-12-18 DIAGNOSIS — M5412 Radiculopathy, cervical region: Secondary | ICD-10-CM | POA: Diagnosis not present

## 2023-12-22 DIAGNOSIS — M5412 Radiculopathy, cervical region: Secondary | ICD-10-CM | POA: Diagnosis not present

## 2024-01-15 DIAGNOSIS — M5412 Radiculopathy, cervical region: Secondary | ICD-10-CM | POA: Diagnosis not present

## 2024-01-19 DIAGNOSIS — M5412 Radiculopathy, cervical region: Secondary | ICD-10-CM | POA: Diagnosis not present

## 2024-01-23 DIAGNOSIS — J329 Chronic sinusitis, unspecified: Secondary | ICD-10-CM | POA: Diagnosis not present

## 2024-01-31 DIAGNOSIS — H9071 Mixed conductive and sensorineural hearing loss, unilateral, right ear, with unrestricted hearing on the contralateral side: Secondary | ICD-10-CM | POA: Diagnosis not present

## 2024-03-26 DIAGNOSIS — L578 Other skin changes due to chronic exposure to nonionizing radiation: Secondary | ICD-10-CM | POA: Diagnosis not present

## 2024-03-26 DIAGNOSIS — D2272 Melanocytic nevi of left lower limb, including hip: Secondary | ICD-10-CM | POA: Diagnosis not present

## 2024-03-26 DIAGNOSIS — Z86018 Personal history of other benign neoplasm: Secondary | ICD-10-CM | POA: Diagnosis not present

## 2024-03-26 DIAGNOSIS — D225 Melanocytic nevi of trunk: Secondary | ICD-10-CM | POA: Diagnosis not present

## 2024-05-15 DIAGNOSIS — J029 Acute pharyngitis, unspecified: Secondary | ICD-10-CM | POA: Diagnosis not present

## 2024-05-15 DIAGNOSIS — R059 Cough, unspecified: Secondary | ICD-10-CM | POA: Diagnosis not present

## 2024-05-15 DIAGNOSIS — J019 Acute sinusitis, unspecified: Secondary | ICD-10-CM | POA: Diagnosis not present

## 2024-05-15 DIAGNOSIS — Z20822 Contact with and (suspected) exposure to covid-19: Secondary | ICD-10-CM | POA: Diagnosis not present

## 2024-08-28 DIAGNOSIS — H8101 Meniere's disease, right ear: Secondary | ICD-10-CM | POA: Diagnosis not present

## 2024-08-28 DIAGNOSIS — H9042 Sensorineural hearing loss, unilateral, left ear, with unrestricted hearing on the contralateral side: Secondary | ICD-10-CM | POA: Diagnosis not present

## 2024-08-28 DIAGNOSIS — H9311 Tinnitus, right ear: Secondary | ICD-10-CM | POA: Diagnosis not present

## 2024-10-21 DIAGNOSIS — R519 Headache, unspecified: Secondary | ICD-10-CM | POA: Diagnosis not present

## 2024-10-21 DIAGNOSIS — Z1330 Encounter for screening examination for mental health and behavioral disorders, unspecified: Secondary | ICD-10-CM | POA: Diagnosis not present

## 2024-10-21 DIAGNOSIS — G43709 Chronic migraine without aura, not intractable, without status migrainosus: Secondary | ICD-10-CM | POA: Diagnosis not present

## 2024-11-14 DIAGNOSIS — R42 Dizziness and giddiness: Secondary | ICD-10-CM | POA: Diagnosis not present

## 2024-11-14 DIAGNOSIS — R519 Headache, unspecified: Secondary | ICD-10-CM | POA: Diagnosis not present

## 2025-01-09 ENCOUNTER — Other Ambulatory Visit: Payer: Self-pay | Admitting: Family Medicine

## 2025-01-09 DIAGNOSIS — R42 Dizziness and giddiness: Secondary | ICD-10-CM

## 2025-01-10 ENCOUNTER — Encounter: Payer: Self-pay | Admitting: Family Medicine

## 2025-01-14 ENCOUNTER — Inpatient Hospital Stay
Admission: RE | Admit: 2025-01-14 | Discharge: 2025-01-14 | Disposition: A | Payer: Self-pay | Source: Ambulatory Visit | Attending: Neurosurgery | Admitting: Neurosurgery

## 2025-01-14 ENCOUNTER — Ambulatory Visit
Admission: RE | Admit: 2025-01-14 | Discharge: 2025-01-14 | Disposition: A | Source: Ambulatory Visit | Attending: Family Medicine | Admitting: Family Medicine

## 2025-01-14 ENCOUNTER — Other Ambulatory Visit: Payer: Self-pay | Admitting: Family Medicine

## 2025-01-14 ENCOUNTER — Encounter: Payer: Self-pay | Admitting: Radiology

## 2025-01-14 ENCOUNTER — Other Ambulatory Visit

## 2025-01-14 ENCOUNTER — Other Ambulatory Visit: Payer: Self-pay

## 2025-01-14 DIAGNOSIS — R42 Dizziness and giddiness: Secondary | ICD-10-CM

## 2025-01-14 DIAGNOSIS — Z049 Encounter for examination and observation for unspecified reason: Secondary | ICD-10-CM

## 2025-01-14 MED ORDER — IOPAMIDOL (ISOVUE-370) INJECTION 76%
75.0000 mL | Freq: Once | INTRAVENOUS | Status: AC | PRN
  Administered 2025-01-14: 75 mL via INTRAVENOUS

## 2025-01-15 DIAGNOSIS — H9190 Unspecified hearing loss, unspecified ear: Secondary | ICD-10-CM | POA: Insufficient documentation

## 2025-01-17 ENCOUNTER — Encounter: Payer: Self-pay | Admitting: Neurosurgery

## 2025-01-17 ENCOUNTER — Other Ambulatory Visit (HOSPITAL_COMMUNITY): Payer: Self-pay | Admitting: Neurosurgery

## 2025-01-17 ENCOUNTER — Ambulatory Visit: Admitting: Neurosurgery

## 2025-01-17 VITALS — BP 116/72 | HR 80 | Temp 98.2°F | Ht 77.0 in | Wt 239.0 lb

## 2025-01-17 DIAGNOSIS — R55 Syncope and collapse: Secondary | ICD-10-CM
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
## 2025-01-21 ENCOUNTER — Ambulatory Visit: Admitting: Cardiology

## 2025-02-14 ENCOUNTER — Ambulatory Visit: Admitting: Neurosurgery
